# Patient Record
Sex: Male | Born: 1960 | Race: White | Hispanic: No | Marital: Married | Smoking: Never smoker
Health system: Southern US, Community
[De-identification: ages and names within clinical notes are randomized; demographics above are authoritative.]

## PROBLEM LIST (undated history)

## (undated) DIAGNOSIS — N529 Male erectile dysfunction, unspecified: Secondary | ICD-10-CM

## (undated) DIAGNOSIS — I712 Thoracic aortic aneurysm, without rupture, unspecified: Secondary | ICD-10-CM

## (undated) DIAGNOSIS — F419 Anxiety disorder, unspecified: Secondary | ICD-10-CM

## (undated) DIAGNOSIS — N400 Enlarged prostate without lower urinary tract symptoms: Secondary | ICD-10-CM

## (undated) DIAGNOSIS — I10 Essential (primary) hypertension: Secondary | ICD-10-CM

## (undated) HISTORY — PX: TRANSURETHRAL RESECTION OF PROSTATE: SHX73

---

## 2017-10-11 ENCOUNTER — Encounter (HOSPITAL_COMMUNITY): Payer: Self-pay | Admitting: *Deleted

## 2017-10-11 ENCOUNTER — Other Ambulatory Visit: Payer: Self-pay

## 2017-10-11 ENCOUNTER — Observation Stay (HOSPITAL_COMMUNITY)
Admission: EM | Admit: 2017-10-11 | Discharge: 2017-10-11 | Disposition: A | Discharge status: Home / Self Care | Payer: No Typology Code available for payment source | Attending: Family Medicine | Admitting: Family Medicine

## 2017-10-11 ENCOUNTER — Emergency Department (HOSPITAL_COMMUNITY): Payer: No Typology Code available for payment source

## 2017-10-11 DIAGNOSIS — I10 Essential (primary) hypertension: Secondary | ICD-10-CM | POA: Insufficient documentation

## 2017-10-11 DIAGNOSIS — I712 Thoracic aortic aneurysm, without rupture: Secondary | ICD-10-CM | POA: Insufficient documentation

## 2017-10-11 DIAGNOSIS — F419 Anxiety disorder, unspecified: Secondary | ICD-10-CM | POA: Insufficient documentation

## 2017-10-11 DIAGNOSIS — N400 Enlarged prostate without lower urinary tract symptoms: Secondary | ICD-10-CM | POA: Diagnosis not present

## 2017-10-11 DIAGNOSIS — R0789 Other chest pain: Secondary | ICD-10-CM

## 2017-10-11 DIAGNOSIS — R0609 Other forms of dyspnea: Secondary | ICD-10-CM | POA: Diagnosis not present

## 2017-10-11 DIAGNOSIS — R0602 Shortness of breath: Secondary | ICD-10-CM | POA: Diagnosis present

## 2017-10-11 DIAGNOSIS — I7121 Aneurysm of the ascending aorta, without rupture: Secondary | ICD-10-CM | POA: Insufficient documentation

## 2017-10-11 HISTORY — DX: Thoracic aortic aneurysm, without rupture: I71.2

## 2017-10-11 HISTORY — DX: Essential (primary) hypertension: I10

## 2017-10-11 HISTORY — DX: Thoracic aortic aneurysm, without rupture, unspecified: I71.20

## 2017-10-11 HISTORY — DX: Anxiety disorder, unspecified: F41.9

## 2017-10-11 HISTORY — DX: Benign prostatic hyperplasia without lower urinary tract symptoms: N40.0

## 2017-10-11 HISTORY — DX: Male erectile dysfunction, unspecified: N52.9

## 2017-10-11 LAB — I-STAT TROPONIN, ED: Troponin i, poc: 0 ng/mL (ref 0.00–0.08)

## 2017-10-11 LAB — CBC
HEMATOCRIT: 43.5 % (ref 39.0–52.0)
Hemoglobin: 15.4 g/dL (ref 13.0–17.0)
MCH: 30.7 pg (ref 26.0–34.0)
MCHC: 35.4 g/dL (ref 30.0–36.0)
MCV: 86.7 fL (ref 78.0–100.0)
PLATELETS: 171 10*3/uL (ref 150–400)
RBC: 5.02 MIL/uL (ref 4.22–5.81)
RDW: 12.4 % (ref 11.5–15.5)
WBC: 5.3 10*3/uL (ref 4.0–10.5)

## 2017-10-11 LAB — BASIC METABOLIC PANEL
Anion gap: 8 (ref 5–15)
BUN: 11 mg/dL (ref 6–20)
CHLORIDE: 107 mmol/L (ref 101–111)
CO2: 22 mmol/L (ref 22–32)
CREATININE: 0.73 mg/dL (ref 0.61–1.24)
Calcium: 9.2 mg/dL (ref 8.9–10.3)
GFR calc Af Amer: >60 mL/min (ref >60)
GFR calc non Af Amer: >60 mL/min (ref >60)
GLUCOSE: 98 mg/dL (ref 65–99)
POTASSIUM: 3.7 mmol/L (ref 3.5–5.1)
SODIUM: 137 mmol/L (ref 135–145)

## 2017-10-11 LAB — TROPONIN I

## 2017-10-11 MED ORDER — SODIUM CHLORIDE 0.9 % IV SOLN: 250.0000 mL | INTRAVENOUS | Status: DC | PRN

## 2017-10-11 MED ORDER — ALPRAZOLAM 0.5 MG PO TABS: 0.5000 mg | ORAL_TABLET | Freq: Three times a day (TID) | ORAL | 0 refills | Status: AC | PRN

## 2017-10-11 MED ORDER — HYDRALAZINE HCL 20 MG/ML IJ SOLN: 5.0000 mg | INTRAMUSCULAR | Status: DC | PRN

## 2017-10-11 MED ORDER — SODIUM CHLORIDE 0.9% FLUSH: 3.0000 mL | Freq: Two times a day (BID) | INTRAVENOUS | Status: DC

## 2017-10-11 MED ORDER — SODIUM CHLORIDE 0.9% FLUSH: 3.0000 mL | INTRAVENOUS | Status: DC | PRN

## 2017-10-11 MED ORDER — TRAZODONE HCL 50 MG PO TABS: 50.0000 mg | ORAL_TABLET | Freq: Every evening | ORAL | 0 refills | Status: AC | PRN

## 2017-10-11 MED ORDER — TRAZODONE HCL 50 MG PO TABS: 50.0000 mg | ORAL_TABLET | Freq: Every evening | ORAL | 0 refills | Status: DC | PRN

## 2017-10-11 MED ORDER — IOPAMIDOL (ISOVUE-370) INJECTION 76%
INTRAVENOUS | Status: AC
  Administered 2017-10-11: 100 mL
  Filled 2017-10-11: qty 100

## 2017-10-11 MED ORDER — ENOXAPARIN SODIUM 40 MG/0.4ML ~~LOC~~ SOLN
40.0000 mg | SUBCUTANEOUS | Status: DC
  Filled 2017-10-11: qty 0.4

## 2017-10-11 MED ORDER — ALPRAZOLAM 0.5 MG PO TABS: 0.5000 mg | ORAL_TABLET | Freq: Three times a day (TID) | ORAL | Status: DC | PRN

## 2017-10-11 MED ORDER — ASPIRIN 81 MG PO CHEW
324.0000 mg | CHEWABLE_TABLET | Freq: Once | ORAL | Status: AC
  Administered 2017-10-11: 324 mg via ORAL
  Filled 2017-10-11: qty 4

## 2017-10-11 MED ORDER — ALPRAZOLAM 0.5 MG PO TABS: 0.5000 mg | ORAL_TABLET | Freq: Three times a day (TID) | ORAL | 0 refills | Status: DC | PRN

## 2017-10-11 NOTE — H&P (Addendum)
History and Physical    Ronald Eaton DOB: December 02, 1960 DOA: 10/11/2017  PCP: System, Pcp Not In Patient coming from: airport   Chief Complaint: sob  HPI: Ronald Eaton is a 56 y.o. male with medical history significant of BPH s/p TURP, anxiety, HTN, thoracic aortic aneurysm.   Pt in town from [place] visiting for Thanksgiving. Pt set to fly home on day of admission when he developed progressive SOB and DOE. Pt felt like he was unable to breath and could not board his plane. This was associated w/ chest tightness. Denies dizziness, vertigo, lightheadedness, palpitations, radiation of chest tightness to neck or arm,.  Patient has a long-standing history of relative cardiac health given the fact that he is an avid runner. He was readmitted on an ace of 6 m/mi. pace. Up until August of this year he was still running very actively and would typically run for 30 minutes multiple days per week and a 9 minute mile pace. Starting on this patient was initially diagnosed with prostatitis and subsequently with BPH and underwent TURP in early October. During that workup patient was treated with Flomax which made him very lightheaded and had to stop the medication. On 09/06/2017 patient was seen by primary care physician due to concerns for palpitations and hypertension. Since that time patient has had an extensive workup which is included on Holter monitor which showed no significant conduction problems, cardiac echo which showed mild valvular dysfunction and mild diastolic dysfunction, coronary CT which showed patent coronary arteries with no calcifications. Patient was noted to have a mildly elevated blood pressure and intermittent tachycardia so he was started on metoprolol in hopes that this would curb those effects as well as his anxiety. Patient was also started on Klonopin with some improvement in his symptoms. Patient denies any issues with being able to lie flat and sleep at night, lower extremity  swelling, or acute chest pain. Patient describes the shortness of breath as a sensation of being unable to fully catch his breath from time to time. Patient slept very little the night prior to admission. Patient stopped his metoprolol approximately 2-1/2 days prior to admission. He did this due to the fact that he feels that this is the cause of his symptoms.   Of note pt works as a Engineer, technical salesT professionally.     ED Course: Workup as outlined below. Completely unremarkable.  Review of Systems: As per HPI otherwise all other systems reviewed and are negative  Ambulatory Status: no restrictions  Past Medical History:  Diagnosis Date  . Anxiety   . BPH (benign prostatic hyperplasia)   . ED (erectile dysfunction)   . Hypertension   . Thoracic aortic aneurysm [facility](HCC)     Past Surgical History:  Procedure Laterality Date  . TRANSURETHRAL RESECTION OF PROSTATE      Social History   Socioeconomic History  . Marital status: Married    Spouse name: Not on file  . Number of children: Not on file  . Years of education: Not on file  . Highest education level: Not on file  Social Needs  . Financial resource strain: Not on file  . Food insecurity - worry: Not on file  . Food insecurity - inability: Not on file  . Transportation needs - medical: Not on file  . Transportation needs - non-medical: Not on file  Occupational History  . Not on file  Tobacco Use  . Smoking status: Never Smoker  . Smokeless tobacco: Never Used  Substance  and Sexual Activity  . Alcohol use: No    Frequency: Never  . Drug use: No  . Sexual activity: Not on file  Other Topics Concern  . Not on file  Social History Narrative  . Not on file    Allergies  Allergen Reactions  . Metoprolol Shortness Of Breath    Patient began to have shortness of breath after taking this medication for several days, but continued to have shortness of breath after stopping the medication.    Family History  Problem Relation  Age of Onset  . Atrial fibrillation Mother   . Atrial fibrillation Father       Prior to Admission medications   Medication Sig Start Date End Date Taking? Authorizing Provider  acetaminophen (TYLENOL) 500 MG tablet Take 1,000 mg by mouth every 6 (six) hours as needed for mild pain or headache.   Yes [provider]  clonazePAM (KLONOPIN) 0.5 MG tablet Take 0.5 tablets by mouth 2 (two) times daily as needed. 09/24/17  Yes [provider]    Physical Exam: Vitals:   10/11/17 1000 10/11/17 1030 10/11/17 1100 10/11/17 1130  BP:  (!) 116/94 115/89 123/89  Pulse: 73 91 75 65  Resp: 11 12 16 14   Temp:      TempSrc:      SpO2: 99% 98% 98% 98%     General:  Appears calm and comfortable Eyes:  PERRL, EOMI, normal lids, iris ENT:  grossly normal hearing, lips & tongue, mmm Neck:  no LAD, masses or thyromegaly Cardiovascular:  RRR, no m/r/g. No LE edema.  Respiratory:  CTA bilaterally, no w/r/r. Normal respiratory effort. Abdomen:  soft, ntnd, NABS Skin:  no rash or induration seen on limited exam Musculoskeletal:  grossly normal tone BUE/BLE, good ROM, no bony abnormality Psychiatric:  grossly normal mood and affect, speech fluent and appropriate, AOx3 Neurologic:  CN 2-12 grossly intact, moves all extremities in coordinated fashion, sensation intact  Labs on Admission: I have personally reviewed following labs and imaging studies  CBC: Recent Labs  Lab 10/11/17 0608  WBC 5.3  HGB 15.4  HCT 43.5  MCV 86.7  PLT 171   Basic Metabolic Panel: Recent Labs  Lab 10/11/17 0608  NA 137  K 3.7  CL 107  CO2 22  GLUCOSE 98  BUN 11  CREATININE 0.73  CALCIUM 9.2   GFR: CrCl cannot be calculated (Unknown ideal weight.). Liver Function Tests: No results for input(s): AST, ALT, ALKPHOS, BILITOT, PROT, ALBUMIN in the last 168 hours. No results for input(s): LIPASE, AMYLASE in the last 168 hours. No results for input(s): AMMONIA in the last 168 hours. Coagulation  Profile: No results for input(s): INR, PROTIME in the last 168 hours. Cardiac Enzymes: No results for input(s): CKTOTAL, CKMB, CKMBINDEX, TROPONINI in the last 168 hours. BNP (last 3 results) No results for input(s): PROBNP in the last 8760 hours. HbA1C: No results for input(s): HGBA1C in the last 72 hours. CBG: No results for input(s): GLUCAP in the last 168 hours. Lipid Profile: No results for input(s): CHOL, HDL, LDLCALC, TRIG, CHOLHDL, LDLDIRECT in the last 72 hours. Thyroid Function Tests: No results for input(s): TSH, T4TOTAL, FREET4, T3FREE, THYROIDAB in the last 72 hours. Anemia Panel: No results for input(s): VITAMINB12, FOLATE, FERRITIN, TIBC, IRON, RETICCTPCT in the last 72 hours. Urine analysis: No results found for: COLORURINE, APPEARANCEUR, LABSPEC, PHURINE, GLUCOSEU, HGBUR, BILIRUBINUR, KETONESUR, PROTEINUR, UROBILINOGEN, NITRITE, LEUKOCYTESUR  Creatinine Clearance: CrCl cannot be calculated (Unknown ideal weight.).  Sepsis Labs: @LABRCNTIP (procalcitonin:4,lacticidven:4) )No results found for this or any previous visit (from the past 240 hour(s)).   Radiological Exams on Admission: Dg Chest 2 View  Result Date: 10/11/2017 CLINICAL DATA:  Shortness of breath tonight. Elevated blood pressure. EXAM: CHEST  2 VIEW COMPARISON:  None. FINDINGS: Normal heart size and pulmonary vascularity. Emphysematous changes in the lungs. No airspace disease or consolidation. No blunting of costophrenic angles. No pneumothorax. Mediastinal contours appear intact. IMPRESSION: No evidence of active pulmonary disease. Emphysematous changes in the lungs. Electronically Signed   By: Burman Nieves M.D.   On: 10/11/2017 06:36   Ct Angio Chest Pe W And/or Wo Contrast  Result Date: 10/11/2017 CLINICAL DATA:  History mild aortic aneurysm, patient from [place] so imaging there, patient placed on beta blocker one week ago ever since SOB, no chest pain EXAM: CT ANGIOGRAPHY CHEST WITH CONTRAST  TECHNIQUE: Multidetector CT imaging of the chest was performed using the standard protocol during bolus administration of intravenous contrast. Multiplanar CT image reconstructions and MIPs were obtained to evaluate the vascular anatomy. CONTRAST:  ISOVUE-370 IOPAMIDOL (ISOVUE-370) INJECTION 76% COMPARISON:  Chest x-ray 10/11/2017 FINDINGS: Cardiovascular: The pulmonary arteries are well opacified. There is no acute pulmonary embolus. Ascending aorta is 4.1 cm. Descending aorta is normal caliber. Heart size is normal. No pericardial effusion. Mediastinum/Nodes: Esophagus is normal in appearance. No mediastinal, hilar, or axillary adenopathy. The visualized portion of the thyroid gland has a normal appearance. Lungs/Pleura: Lungs are clear. No pleural effusion or pneumothorax. Upper Abdomen: Unremarkable. Musculoskeletal: No chest wall abnormality. No acute or significant osseous findings. Review of the MIP images confirms the above findings. IMPRESSION: 1. Technically adequate exam showing no acute pulmonary embolus. 2. Ascending aortic aneurysm, 4.1 cm. Recommend annual imaging followup by CTA or MRA. This recommendation follows 2010 ACCF/AHA/AATS/ACR/ASA/SCA/SCAI/SIR/STS/SVM Guidelines for the Diagnosis and Management of Patients with Thoracic Aortic Disease. Circulation. 2010; 121: Z610-R604 3. No pulmonary edema or consolidations. Electronically Signed   By: Norva Pavlov M.D.   On: 10/11/2017 08:46    EKG: Independently reviewed. Partial EKG in system. NSR, no ACS. EDP w/ full EKG  Assessment/Plan Active Problems:   SOB (shortness of breath)   Anxiety   Essential hypertension   BPH (benign prostatic hyperplasia)    SOB/chest tightness: suspect anxiety/psychosomatic, and possibly medication induced as symptoms started after initiation of metoprolol.. Extensive cardiac workup prior to evaluation at [facility] as outlined in history of present illness. Currently troponin negative and EKG  without signs of ACS. CT w/o evidence of PE or other acute lung process. Patient states that he feels like he is falling apart from a physical standpoint. These feelings have been present and escalating ever since August due to medical concerns again as outlined in history of present illness. ASA 324 ministered by ED. Fmhx of Afib in both parents. - Treatment of anxiety as below  - cycle trop and tele until DC - Continue holding metoprolol  - Pt wanting to make sure he is safe so will consult cardiology for consultation. Will f/u w/ recommendations for hospital workup vs safe DC.  Anxiety: started klonopin on 10/22. Suspect a lot of this has come on due to pt being very active and healthy overall to dealing with the mental strain that comes w/ becoming ill and requiring surgery (prostatitis and BPH>>>TURP). Klonopin was beneficial w/ no change with treatment w/ metoprolol. Tried prozac for 2-3 days but stopped due to side effects. - Change from klonopin to Xanax -  consider outpt initiation of SNRI  HTN: situational. BP in ED near normal - Hydralazine prn w/ parameters entered  BPH: s/p TRP in august of 2018. No urinary complaints.   Anticipate DC once cleared by cardiology.      DVT prophylaxis: Lovenox  Code Status: full  Family Communication: wife  Disposition Plan: pending eval by cardiology  Consults called: cardiology  Admission status: observation    Ozella Rocksavid J Mariann Palo MD [facility]  If 7PM-7AM, please contact night-coverage [email or web address] Password [email or web address]  10/11/2017, 11:45 AM

## 2017-10-11 NOTE — Consult Note (Signed)
Cardiology Consult    Patient ID: Ronald Eaton MRN: [id number], DOB/AGE: Jun 27, 1961   Admit date: 10/11/2017 Date of Consult: 10/11/2017  Primary Physician: System, Pcp Not In Primary Cardiologist: n/a Requesting Provider: Dr. Konrad DoloresMerrell  Reason for Consult: dyspnea  Patient Profile    Ronald MacadamiaSteven Eddleman is a 56 y.o. male who is being seen today for the evaluation of dyspnea at the request of Dr. Konrad DoloresMerrell. Patient had negative istat and serum troponin and no ischemic EKG changes.  Past Medical History   Past Medical History:  Diagnosis Date  . Anxiety   . BPH (benign prostatic hyperplasia)   . ED (erectile dysfunction)   . Hypertension   . Thoracic aortic aneurysm [facility](HCC)     Past Surgical History:  Procedure Laterality Date  . TRANSURETHRAL RESECTION OF PROSTATE      Allergies  Allergies  Allergen Reactions  . Metoprolol Shortness Of Breath    Patient began to have shortness of breath after taking this medication for several days, but continued to have shortness of breath after stopping the medication.   History of Present Illness    Ronald Eaton is a 56yo male with PMH of HTN, and ascending aortic aneurysm [facility] with complaints of dyspnea. Patient states that for the last few weeks he has been experiencing dyspnea sometimes associated with palpitations. Patient states that he had been in good health until August until he was found to have multiple ticks on his scrotum and also diagnosed with prostatitis which were treated appropriately. He then developed signs and symptoms of BPH and was intolerant of medical therapy so he underwent TURP. Afterwards, patient started having some palpitations and the onset of dyspnea. He has been evaluated by his PCP and cardiologist in [place]. He has had normal Holter monitor readings, echo showed mild 2 valve regurg (from what he could remember), coronary calcium score was 0, and exercise stress test was normal. Patient was placed on  metoprolol for palpitations; he felt his dyspnea was worse with this so he cut down to 12.5mg  BID and eventually stopped 2-3 days ago.  Patient is here visiting for the last week and endorses overall feeling terrible the entire time with the above symptoms; he tried walking and running which seemed to exacerbate his dyspnea. Prior to last two days, he has only had symptoms while awake, but last two nights he has woken up gasping and clearing his throat. He endorses some post-nasal drip. He denies chest pain; endorses occasional dizziness on standing when he misses a meal. He had two episodes of syncope during micturition prior to his TURP. He feels that his throat is sometimes tight as well. He denies fevers, chills, productive cough.   Inpatient Medications    . enoxaparin (LOVENOX) injection  40 mg Subcutaneous Q24H  . sodium chloride flush  3 mL Intravenous Q12H   Outpatient Medications    Prior to Admission medications   Medication Sig Start Date End Date Taking? Authorizing Provider  acetaminophen (TYLENOL) 500 MG tablet Take 1,000 mg by mouth every 6 (six) hours as needed for mild pain or headache.   Yes [provider]  clonazePAM (KLONOPIN) 0.5 MG tablet Take 0.5 tablets by mouth 2 (two) times daily as needed. 09/24/17  Yes [provider]   Family History    Family History  Problem Relation Age of Onset  . Atrial fibrillation Mother   . Atrial fibrillation Father    Social History    Social History  Socioeconomic History  . Marital status: Married    Spouse name: Not on file  . Number of children: Not on file  . Years of education: Not on file  . Highest education level: Not on file  Social Needs  . Financial resource strain: Not on file  . Food insecurity - worry: Not on file  . Food insecurity - inability: Not on file  . Transportation needs - medical: Not on file  . Transportation needs - non-medical: Not on file  Occupational History  . Not on  file  Tobacco Use  . Smoking status: Never Smoker  . Smokeless tobacco: Never Used  Substance and Sexual Activity  . Alcohol use: No    Frequency: Never  . Drug use: No  . Sexual activity: Not on file  Other Topics Concern  . Not on file  Social History Narrative  . Not on file     Review of Systems    All other systems reviewed and are otherwise negative except as noted above.  Physical Exam    Blood pressure 117/88, pulse 70, temperature (!) 97.5 F (36.4 C), temperature source Oral, resp. rate 10, SpO2 99 %.  General: NAD Psych: Normal affect. Neuro: Alert and oriented X 3. Moves all extremities spontaneously. HEENT: Normal  Neck: Supple without bruits or JVD. Lungs:  Resp regular and unlabored, CTA. Heart: RRR no s3, s4, or murmurs. Abdomen: Soft, non-tender, non-distended, +BS.  Extremities: No clubbing, cyanosis or edema. DP/PT/Radials 2+ and equal bilaterally.  Labs    Troponin [facility](Point of Care Test) Recent Labs    10/11/17 0617  TROPIPOC 0.00   Recent Labs    10/11/17 1213  TROPONINI <0.03   Lab Results  Component Value Date   WBC 5.3 10/11/2017   HGB 15.4 10/11/2017   HCT 43.5 10/11/2017   MCV 86.7 10/11/2017   PLT 171 10/11/2017    Recent Labs  Lab 10/11/17 0608  NA 137  K 3.7  CL 107  CO2 22  BUN 11  CREATININE 0.73  CALCIUM 9.2  GLUCOSE 98   No results found for: CHOL, HDL, LDLCALC, TRIG No results found for: [facility]   Radiology Studies    Dg Chest 2 View  Result Date: 10/11/2017 CLINICAL DATA:  Shortness of breath tonight. Elevated blood pressure. EXAM: CHEST  2 VIEW COMPARISON:  None. FINDINGS: Normal heart size and pulmonary vascularity. Emphysematous changes in the lungs. No airspace disease or consolidation. No blunting of costophrenic angles. No pneumothorax. Mediastinal contours appear intact. IMPRESSION: No evidence of active pulmonary disease. Emphysematous changes in the lungs. Electronically Signed   By: Burman NievesWilliam  Stevens  M.D.   On: 10/11/2017 06:36   Ct Angio Chest Pe W And/or Wo Contrast  Result Date: 10/11/2017 CLINICAL DATA:  History mild aortic aneurysm, patient from [place] so imaging there, patient placed on beta blocker one week ago ever since SOB, no chest pain EXAM: CT ANGIOGRAPHY CHEST WITH CONTRAST TECHNIQUE: Multidetector CT imaging of the chest was performed using the standard protocol during bolus administration of intravenous contrast. Multiplanar CT image reconstructions and MIPs were obtained to evaluate the vascular anatomy. CONTRAST:  100mL ISOVUE-370 IOPAMIDOL (ISOVUE-370) INJECTION 76% COMPARISON:  Chest x-ray 10/11/2017 FINDINGS: Cardiovascular: The pulmonary arteries are well opacified. There is no acute pulmonary embolus. Ascending aorta is 4.1 cm. Descending aorta is normal caliber. Heart size is normal. No pericardial effusion. Mediastinum/Nodes: Esophagus is normal in appearance. No mediastinal, hilar, or axillary adenopathy. The visualized portion of the  thyroid gland has a normal appearance. Lungs/Pleura: Lungs are clear. No pleural effusion or pneumothorax. Upper Abdomen: Unremarkable. Musculoskeletal: No chest wall abnormality. No acute or significant osseous findings. Review of the MIP images confirms the above findings. IMPRESSION: 1. Technically adequate exam showing no acute pulmonary embolus. 2. Ascending aortic aneurysm, 4.1 cm. Recommend annual imaging followup by CTA or MRA. This recommendation follows 2010 ACCF/AHA/AATS/ACR/ASA/SCA/SCAI/SIR/STS/SVM Guidelines for the Diagnosis and Management of Patients with Thoracic Aortic Disease. Circulation. 2010; 121: U981-X914 3. No pulmonary edema or consolidations. Electronically Signed   By: Norva Pavlov M.D.   On: 10/11/2017 08:46   ECG & Cardiac Imaging    EKG, personally reviewed - SR  Patient - echo with mild valvular dysfunction and mild diastolic dysfunction; coronary CT with patent coronary arteries with no  calcifications  Assessment & Plan    Dyspnea: Some concerning symptoms with dizziness on standing and worsening with exertion, however in setting of overall normal workup per patient, would not consider further cardiac workup at this time. CTA was unremarkable from pulmonary standpoint as well.   Ascending aortic aneurysm: 4.1cm in diameter - per patient stable size from most recent imaging prior to today.  --annual f/u with CTA or MRA  HTN: Metoprolol held. Patient hesitant to start any BP meds; on review he has had intermittent elevated diastolic BP. Could consider outpatient monitoring and further discussion with his PCP on starting new meds if consistently elevated.   Signed, Nyra Market, MD 10/11/2017, 1:40 PM   I have examined the patient and reviewed assessment and plan and discussed with patient.  Agree with above as stated.  He has had a negative workup.  Negative troponins, ECG without abnormality.  He had a coronary CT scan in [place] with a calcium score of 0.  Negative PE workup.  No clear cardiac cause for his shortness of breath.  He should follow-up with his primary care doctor regarding his blood pressure.  I recommended a low-salt diet along with regular exercise to help control his blood pressure.  He has a small aortic aneurysm as well.  No abnormalities on exam consistent with significant valvular disease.  He had been on metoprolol.  He felt worse on the metoprolol compared to off the metoprolol.  I recommended that he stop this medicine.  Ronald Eaton

## 2017-10-11 NOTE — ED Notes (Signed)
This RN spoke with Dr Konrad DoloresMerrell, pt is waiting on cardiology to come see him and if cardiology gives the the okay, then he is being discharged home. Wife provided with a noted stating that the pt is in the hospital to give to the airport. Pt sitting up in a chair at the bedside, VSS, NAD.

## 2017-10-11 NOTE — Discharge Instructions (Signed)
It is unlikely that your symptoms of chest tightness and shortness of breath are due to your heart or lungs. They are likely related to a combination of the metoprolol and anxiety and stress. Please follow up with your primary care physician or cardiologist in 1-2weeks. Please use the Xanax for anxiety and symptoms of shortness of breath. Please do not use the metoprolol anymore.

## 2017-10-11 NOTE — ED Provider Notes (Signed)
[facility] EMERGENCY DEPARTMENT Provider Note   CSN: [id number] Arrival date & time: 10/11/17  [id number]     History   Chief Complaint Chief Complaint  Patient presents with  . Shortness of Breath    HPI Ronald Eaton is a 56 y.o. male.  HPI   Ronald Eaton is a 56 y.o. male, with a history of HTN, presenting to the ED with chest tightness and exertional shortness of breath worsening over the last week.  Sensation is constant, worse with sitting upright and ambulation.  States the tightness is across his chest, worse on the left, 5/10. States, "It just feels like I can't get a deep breath."  Patient is concerned his symptoms are connected with his recent placement on metoprolol.  States his PCP placed him on the metoprolol due to concern for HTN as well as anxiety beginning Nov 14. He was on 25 mg twice a day, decreased this to 12.5 mg twice a day, and then 2 days ago stopped taking this medication completely. Patient took a 3-hour plane ride on November 20 from his home in [place] to visit family here in [place].  He states he was having some of his symptoms a couple days prior to this after a 5-hour car ride and then an overnight stay in a hotel.  Patient was supposed to fly home today.  He was short of breath walking to the gate.  Continued to be short of breath at the gate and decided to come to the ED.  Denies fever/chills, N/V/D, diaphoresis, pallor, cough, abdominal pain, or any other complaints.    Past Medical History:  Diagnosis Date  . Anxiety   . ED (erectile dysfunction)   . Hypertension   . Thoracic aortic aneurysm (HCC)     There are no active problems to display for this patient.   Past Surgical History:  Procedure Laterality Date  . TRANSURETHRAL RESECTION OF PROSTATE         Home Medications    Prior to Admission medications   Medication Sig Start Date End Date Taking? Authorizing Provider  acetaminophen (TYLENOL) 500 MG tablet  Take 1,000 mg by mouth every 6 (six) hours as needed for mild pain or headache.   Yes [provider]  clonazePAM (KLONOPIN) 0.5 MG tablet Take 0.5 tablets by mouth 2 (two) times daily as needed. 09/24/17  Yes [provider]    Family History History reviewed. No pertinent family history.  Social History Social History   Tobacco Use  . Smoking status: Never Smoker  . Smokeless tobacco: Never Used  Substance Use Topics  . Alcohol use: No    Frequency: Never  . Drug use: No     Allergies   Metoprolol   Review of Systems Review of Systems  Constitutional: Negative for chills, diaphoresis and fever.  Respiratory: Positive for shortness of breath. Negative for cough.   Gastrointestinal: Negative for abdominal pain, constipation, diarrhea, nausea and vomiting.  Musculoskeletal: Negative for back pain.  Neurological: Negative for dizziness and light-headedness.  All other systems reviewed and are negative.    Physical Exam Updated Vital Signs BP (!) 122/91 (BP Location: Left Arm)   Pulse 79   Temp (!) 97.5 F (36.4 C) (Oral)   Resp 18   SpO2 99%   Physical Exam  Constitutional: He appears well-developed and well-nourished. No distress.  HENT:  Head: Normocephalic and atraumatic.  Eyes: Conjunctivae are normal.  Neck: Neck supple.  Cardiovascular: Normal  rate, regular rhythm, normal heart sounds and intact distal pulses.  Pulmonary/Chest: Breath sounds normal.  Patient appears somewhat uncomfortable at rest.  Increased work of breathing with even minor exertion.  Abdominal: Soft. There is no tenderness. There is no guarding.  Musculoskeletal: He exhibits no edema.  Lymphadenopathy:    He has no cervical adenopathy.  Neurological: He is alert.  Skin: Skin is warm and dry. Capillary refill takes less than 2 seconds. He is not diaphoretic.  Psychiatric: He has a normal mood and affect. His behavior is normal.  Nursing note and vitals  reviewed.    ED Treatments / Results  Labs (all labs ordered are listed, but only abnormal results are displayed) Labs Reviewed  BASIC METABOLIC PANEL  CBC  I-STAT TROPONIN, ED    EKG  EKG Interpretation  Date/Time:  Monday October 11 2017 05:58:51 EST Ventricular Rate:  91 PR Interval:    QRS Duration: 93 QT Interval:  359 QTC Calculation: 442 R Axis:   84 Text Interpretation:  Sinus rhythm minimal ST elevation in V3 Confirmed by Ashland, Courteney ([id number]) on 10/11/2017 9:11:05 AM       Radiology Dg Chest 2 View  Result Date: 10/11/2017 CLINICAL DATA:  Shortness of breath tonight. Elevated blood pressure. EXAM: CHEST  2 VIEW COMPARISON:  None. FINDINGS: Normal heart size and pulmonary vascularity. Emphysematous changes in the lungs. No airspace disease or consolidation. No blunting of costophrenic angles. No pneumothorax. Mediastinal contours appear intact. IMPRESSION: No evidence of active pulmonary disease. Emphysematous changes in the lungs. Electronically Signed   By: Burman Nieves M.D.   On: 10/11/2017 06:36   Ct Angio Chest Pe W And/or Wo Contrast  Result Date: 10/11/2017 CLINICAL DATA:  History mild aortic aneurysm, patient from [place] so imaging there, patient placed on beta blocker one week ago ever since SOB, no chest pain EXAM: CT ANGIOGRAPHY CHEST WITH CONTRAST TECHNIQUE: Multidetector CT imaging of the chest was performed using the standard protocol during bolus administration of intravenous contrast. Multiplanar CT image reconstructions and MIPs were obtained to evaluate the vascular anatomy. CONTRAST:  ISOVUE-370 IOPAMIDOL (ISOVUE-370) INJECTION 76% COMPARISON:  Chest x-ray 10/11/2017 FINDINGS: Cardiovascular: The pulmonary arteries are well opacified. There is no acute pulmonary embolus. Ascending aorta is 4.1 cm. Descending aorta is normal caliber. Heart size is normal. No pericardial effusion. Mediastinum/Nodes: Esophagus is normal in appearance. No  mediastinal, hilar, or axillary adenopathy. The visualized portion of the thyroid gland has a normal appearance. Lungs/Pleura: Lungs are clear. No pleural effusion or pneumothorax. Upper Abdomen: Unremarkable. Musculoskeletal: No chest wall abnormality. No acute or significant osseous findings. Review of the MIP images confirms the above findings. IMPRESSION: 1. Technically adequate exam showing no acute pulmonary embolus. 2. Ascending aortic aneurysm, 4.1 cm. Recommend annual imaging followup by CTA or MRA. This recommendation follows 2010 ACCF/AHA/AATS/ACR/ASA/SCA/SCAI/SIR/STS/SVM Guidelines for the Diagnosis and Management of Patients with Thoracic Aortic Disease. Circulation. 2010; 121: W102-V253 3. No pulmonary edema or consolidations. Electronically Signed   By: Norva Pavlov M.D.   On: 10/11/2017 08:46    Procedures Procedures (including critical care time)  Medications Ordered in ED Medications  aspirin chewable tablet 324 mg (not administered)  iopamidol (ISOVUE-370) 76 % injection (100 mLs  Contrast Given 10/11/17 0823)     Initial Impression / Assessment and Plan / ED Course  I have reviewed the triage vital signs and the nursing notes.  Pertinent labs & imaging results that were available during my care of the patient were  reviewed by me and considered in my medical decision making (see chart for details).  Clinical Course as of Oct 12 951  [facility]  [facility]30 Discussed imaging results with the patient.  Also discussed admission for observation.  Patient voices understanding of this information and is comfortable with the plan.  [SJ]  0948 No evidence of PE.  Patient states he is aware of the ascending thoracic aortic aneurysm.  States he thinks the current measurement of 4.1 cm is consistent with the previous measurement. CT Angio Chest PE W and/or Wo Contrast [SJ]  [id number] Spoke with Shelly Flattenavid Merrell, hospitalist. Agrees to admit the patient.  [SJ]    Clinical Course User  Index [SJ] Joy, Shawn C, PA-C     Patient presents with chest discomfort and shortness of breath. HEART score of 3 and Wells score of 3.  Initial troponin negative. No noted PE on CT. Chest tightness and shortness of breath with exertion. Admission for chest pain rule out and observation.  Findings and plan of care discussed with Bary Castillaourteney Mackuen, MD.   Vitals:   10/11/17 0715 10/11/17 0745 10/11/17 0800 10/11/17 0900  BP: (!) 115/92 (!) 131/96 (!) 123/91   Pulse: 69 79 78 97  Resp: 14 10 17 14   Temp:      TempSrc:      SpO2: 99% 100% 98% 100%    Final Clinical Impressions(s) / ED Diagnoses   Final diagnoses:  SOB (shortness of breath)    ED Discharge Orders    None       Concepcion LivingJoy, Shawn C, PA-C 10/11/17 [id number]    Abelino DerrickMackuen, Courteney Lyn, MD 10/12/17 [phone]

## 2017-10-11 NOTE — ED Triage Notes (Addendum)
Pt is from [place] and has been visiting here for the past week. Pt reports ongoing sob for the past week with difficulty taking a deep breath. Feels that Left side of chest is more tight during inspiration. Pt recently had a full cardiac workup with normal results. Also reports having a scheduled CT of chest today in [place] but has had flight delays to get back home. Has been started on Metoprolol for hypertension and started taking klonopin for anxiety with no improvement of symptoms. Last took metoprolol two days ago.  Lung sounds clear on assessment

## 2017-10-11 NOTE — Discharge Summary (Signed)
Physician Discharge Summary  Ronald Eaton ZOX:[id number] DOB: May 25, 1961 DOA: 10/11/2017  PCP: System, Pcp Not In  Admit date: 10/11/2017 Discharge date: 10/11/2017  Admitted From: airport Disposition:  Home  Recommendations for Outpatient Follow-up:  Follow up with PCP in 1-2 weeks  Home Health:No Equipment/Devices:none  Discharge Condition:stable CODE STATUS:full Diet recommendation: Regular  Brief/Interim Summary: Ronald Eaton is a 56 y.o. male with medical history significant of BPH s/p TURP, anxiety, HTN, thoracic aortic aneurysm.   Pt in town from [place] visiting for Thanksgiving. Pt set to fly home on day of admission when he developed progressive SOB and DOE. Pt felt like he was unable to breath and could not board his plane. This was associated w/ chest tightness. Denies dizziness, vertigo, lightheadedness, palpitations, radiation of chest tightness to neck or arm,.  Patient has a long-standing history of relative cardiac health given the fact that he is an avid runner. He was readmitted on an ace of 6 m/mi. pace. Up until August of this year he was still running very actively and would typically run for 30 minutes multiple days per week and a 9 minute mile pace. Starting on this patient was initially diagnosed with prostatitis and subsequently with BPH and underwent TURP in early October. During that workup patient was treated with Flomax which made him very lightheaded and had to stop the medication. On 09/06/2017 patient was seen by primary care physician due to concerns for palpitations and hypertension. Since that time patient has had an extensive workup which is included on Holter monitor which showed no significant conduction problems, cardiac echo which showed mild valvular dysfunction and mild diastolic dysfunction, coronary CT which showed patent coronary arteries with no calcifications. Patient was noted to have a mildly elevated blood pressure and intermittent tachycardia so  he was started on metoprolol in hopes that this would curb those effects as well as his anxiety. Patient was also started on Klonopin with some improvement in his symptoms. Patient denies any issues with being able to lie flat and sleep at night, lower extremity swelling, or acute chest pain. Patient describes the shortness of breath as a sensation of being unable to fully catch his breath from time to time. Patient slept very little the night prior to admission. Patient stopped his metoprolol approximately 2-1/2 days prior to admission. He did this due to the fact that he feels that this is the cause of his symptoms.    Discharge Diagnoses:  Active Problems:   SOB (shortness of breath)   Anxiety   Essential hypertension   BPH (benign prostatic hyperplasia)  SOB/chest tightness: suspect anxiety/psychosomatic, and possibly medication induced as symptoms started after initiation of metoprolol.. Extensive cardiac workup prior to evaluation at [facility] as outlined in history of present illness. Currently troponin negative and EKG without signs of ACS. CT w/o evidence of PE or other acute lung process. Patient states that he feels like he is falling apart from a physical standpoint. These feelings have been present and escalating ever since August due to medical concerns again as outlined in history of present illness. ASA 324 ministered by ED. Fmhx of Afib in both parents. Cardiology consulted. After their evaluation they also determined that patient's symptoms are not due to cardiac etiology. Patient follow up with home cardiologist after arrival in [place]. Troponins negative 2  Anxiety: started klonopin on 10/22. Suspect a lot of this has come on due to pt being very active and healthy overall to dealing with the mental strain  that comes w/ becoming ill and requiring surgery (prostatitis and BPH>>>TURP). Klonopin was beneficial w/ no change with treatment w/ metoprolol. Tried prozac for 2-3 days but  stopped due to side effects. - Change from klonopin to Xanax - consider outpt initiation of SNRI  Ascending aortic aneurysm: 4.1 on scan. Stable. Patient to follow-up in the outpatient setting.  HTN: situational. BP in ED near normal - Follow-up with primary care physician to see if antihypertensives are still needed. At time of hospitalization patient's blood pressure was well-controlled.  BPH: s/p TRP in august of 2018. No urinary complaints.     Discharge Instructions Follow up with PCP in 1 -2 weeks upon regutrn   Allergies as of 10/11/2017      Reactions   Metoprolol Shortness Of Breath   Patient began to have shortness of breath after taking this medication for several days, but continued to have shortness of breath after stopping the medication.      Medication List    STOP taking these medications   clonazePAM 0.5 MG tablet Commonly known as:  KLONOPIN     TAKE these medications   acetaminophen 500 MG tablet Commonly known as:  TYLENOL Take 1,000 mg by mouth every 6 (six) hours as needed for mild pain or headache.   ALPRAZolam 0.5 MG tablet Commonly known as:  XANAX Take 1 tablet (0.5 mg total) by mouth 3 (three) times daily as needed for anxiety.   traZODone 50 MG tablet Commonly known as:  DESYREL Take 1 tablet (50 mg total) by mouth at bedtime as needed for sleep.       Allergies  Allergen Reactions  . Metoprolol Shortness Of Breath    Patient began to have shortness of breath after taking this medication for several days, but continued to have shortness of breath after stopping the medication.    Consultations: Cardiology  Procedures/Studies: Dg Chest 2 View  Result Date: 10/11/2017 CLINICAL DATA:  Shortness of breath tonight. Elevated blood pressure. EXAM: CHEST  2 VIEW COMPARISON:  None. FINDINGS: Normal heart size and pulmonary vascularity. Emphysematous changes in the lungs. No airspace disease or consolidation. No blunting of costophrenic  angles. No pneumothorax. Mediastinal contours appear intact. IMPRESSION: No evidence of active pulmonary disease. Emphysematous changes in the lungs. Electronically Signed   By: Burman NievesWilliam  Stevens M.D.   On: 10/11/2017 06:36   Ct Angio Chest Pe W And/or Wo Contrast  Result Date: 10/11/2017 CLINICAL DATA:  History mild aortic aneurysm, patient from [place] so imaging there, patient placed on beta blocker one week ago ever since SOB, no chest pain EXAM: CT ANGIOGRAPHY CHEST WITH CONTRAST TECHNIQUE: Multidetector CT imaging of the chest was performed using the standard protocol during bolus administration of intravenous contrast. Multiplanar CT image reconstructions and MIPs were obtained to evaluate the vascular anatomy. CONTRAST:  100mL ISOVUE-370 IOPAMIDOL (ISOVUE-370) INJECTION 76% COMPARISON:  Chest x-ray 10/11/2017 FINDINGS: Cardiovascular: The pulmonary arteries are well opacified. There is no acute pulmonary embolus. Ascending aorta is 4.1 cm. Descending aorta is normal caliber. Heart size is normal. No pericardial effusion. Mediastinum/Nodes: Esophagus is normal in appearance. No mediastinal, hilar, or axillary adenopathy. The visualized portion of the thyroid gland has a normal appearance. Lungs/Pleura: Lungs are clear. No pleural effusion or pneumothorax. Upper Abdomen: Unremarkable. Musculoskeletal: No chest wall abnormality. No acute or significant osseous findings. Review of the MIP images confirms the above findings. IMPRESSION: 1. Technically adequate exam showing no acute pulmonary embolus. 2. Ascending aortic aneurysm, 4.1 cm. Recommend  annual imaging followup by CTA or MRA. This recommendation follows 2010 ACCF/AHA/AATS/ACR/ASA/SCA/SCAI/SIR/STS/SVM Guidelines for the Diagnosis and Management of Patients with Thoracic Aortic Disease. Circulation. 2010; 121: [id number] 3. No pulmonary edema or consolidations. Electronically Signed   By: Norva PavlovElizabeth  Brown M.D.   On: 10/11/2017 08:46   (Echo, Carotid,  EGD, Colonoscopy, ERCP)    Subjective:   Discharge Exam: Vitals:   10/11/17 1545 10/11/17 1600  BP: 127/83 107/87  Pulse: 70   Resp: 13 (!) 21  Temp:    SpO2: 100%    Vitals:   10/11/17 1515 10/11/17 1530 10/11/17 1545 10/11/17 1600  BP: 115/85 115/79 127/83 107/87  Pulse: 67 72 70   Resp: 15 11 13  (!) 21  Temp:      TempSrc:      SpO2: 97% 99% 100%     General: Pt is alert, awake, not in acute distress Cardiovascular: RRR, S1/S2 +, no rubs, no gallops Respiratory: CTA bilaterally, no wheezing, no rhonchi Abdominal: Soft, NT, ND, bowel sounds + Extremities: no edema, no cyanosis    The results of significant diagnostics from this hospitalization (including imaging, microbiology, ancillary and laboratory) are listed below for reference.     Microbiology: No results found for this or any previous visit (from the past 240 hour(s)).   Labs: BNP (last 3 results) No results for input(s): BNP in the last 8760 hours. Basic Metabolic Panel: Recent Labs  Lab 10/11/17 0608  NA 137  K 3.7  CL 107  CO2 22  GLUCOSE 98  BUN 11  CREATININE 0.73  CALCIUM 9.2   Liver Function Tests: No results for input(s): AST, ALT, ALKPHOS, BILITOT, PROT, ALBUMIN in the last 168 hours. No results for input(s): LIPASE, AMYLASE in the last 168 hours. No results for input(s): AMMONIA in the last 168 hours. CBC: Recent Labs  Lab 10/11/17 0608  WBC 5.3  HGB 15.4  HCT 43.5  MCV 86.7  PLT 171   Cardiac Enzymes: Recent Labs  Lab 10/11/17 1213  TROPONINI <0.03   BNP: Invalid input(s): POCBNP CBG: No results for input(s): GLUCAP in the last 168 hours. D-Dimer No results for input(s): DDIMER in the last 72 hours. Hgb A1c No results for input(s): HGBA1C in the last 72 hours. Lipid Profile No results for input(s): CHOL, HDL, LDLCALC, TRIG, CHOLHDL, LDLDIRECT in the last 72 hours. Thyroid function studies No results for input(s): TSH, T4TOTAL, T3FREE, THYROIDAB in the last 72  hours.  Invalid input(s): FREET3 Anemia work up No results for input(s): VITAMINB12, FOLATE, FERRITIN, TIBC, IRON, RETICCTPCT in the last 72 hours. Urinalysis No results found for: COLORURINE, APPEARANCEUR, LABSPEC, PHURINE, GLUCOSEU, HGBUR, BILIRUBINUR, KETONESUR, PROTEINUR, UROBILINOGEN, NITRITE, LEUKOCYTESUR Sepsis Labs Invalid input(s): PROCALCITONIN,  WBC,  LACTICIDVEN Microbiology No results found for this or any previous visit (from the past 240 hour(s)).   Time coordinating discharge: Over 30 minutes  SIGNED:   Ozella Rocksavid J Edie Darley, MD  [facility] 10/11/2017, 4:42 PM Pager   If 7PM-7AM, please contact night-coverage [email or web address] Password [email or web address]

## 2017-10-11 NOTE — ED Notes (Signed)
Dr. Merrell at bedside. 

## 2018-05-29 IMAGING — CT CT ANGIO CHEST
2 of 6 series · 18 of 36 positions shown · IV contrast (Omni 300)
Comparison: Chest x-ray 10/11/2017

CLINICAL DATA: History mild aortic aneurysm, patient from [place] so
imaging there, patient placed on beta blocker one week ago ever
since SOB, no chest pain

EXAM:
CT ANGIOGRAPHY CHEST WITH CONTRAST
TECHNIQUE: Multidetector CT imaging of the chest was performed using the
standard protocol during bolus administration of intravenous
contrast. Multiplanar CT image reconstructions and MIPs were
obtained to evaluate the vascular anatomy.
CONTRAST:  100mL [id number] IOPAMIDOL ([id number]) INJECTION 76%

[Series 8: pe thins · axial · 0.76mm/px · z∈[-144,+104]mm · 17 of 279 slices shown]
[im 16/279  lung]
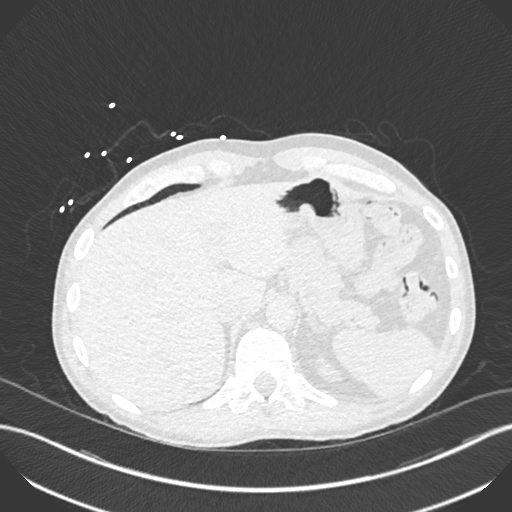
[im 31/279  mediastinal]
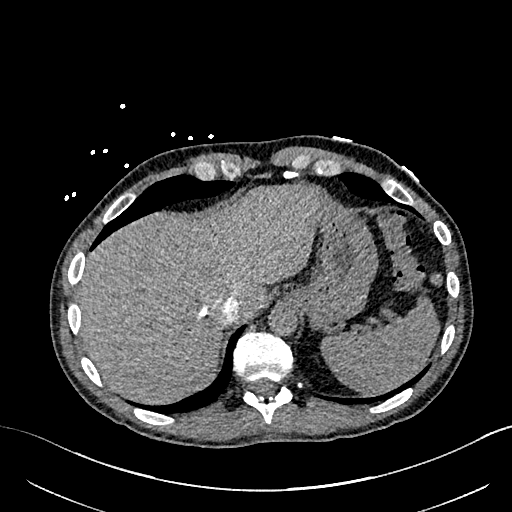
[im 47/279  lung]
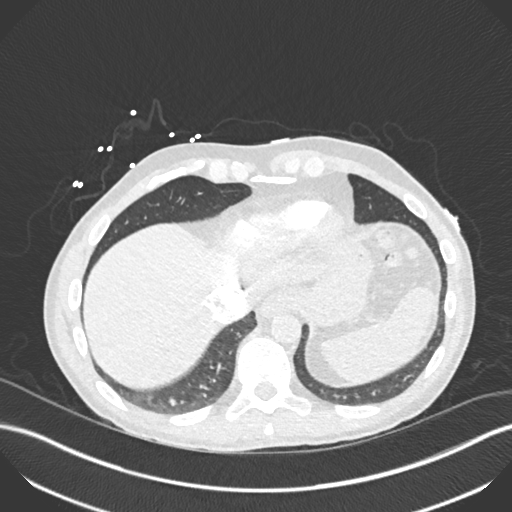
[im 62/279  mediastinal]
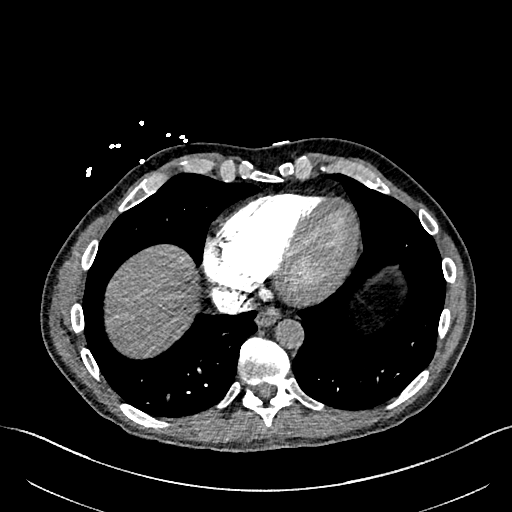
[im 78/279  lung]
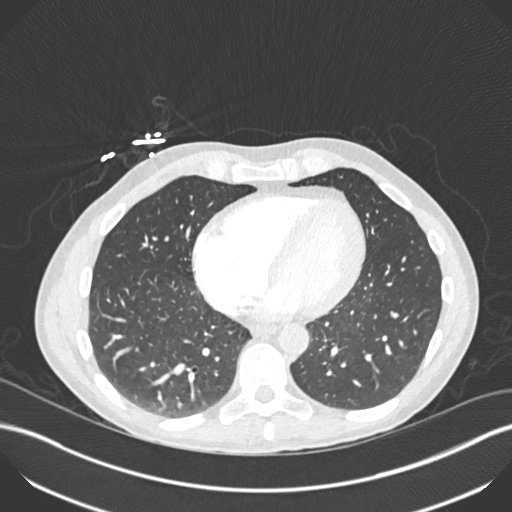
[im 93/279  mediastinal]
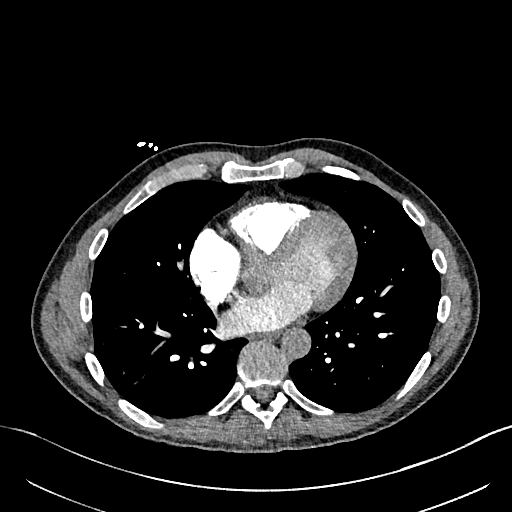
[im 109/279  lung]
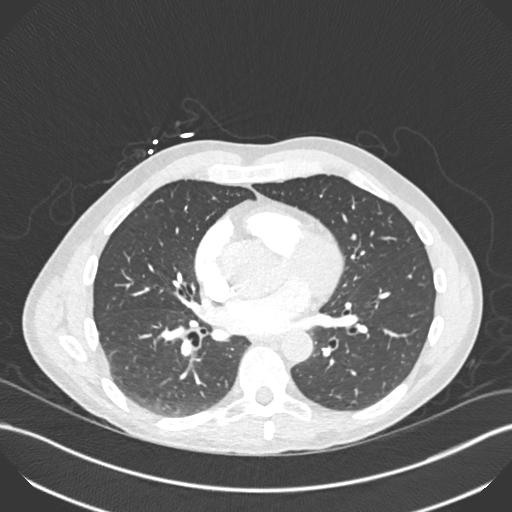
[im 124/279  mediastinal]
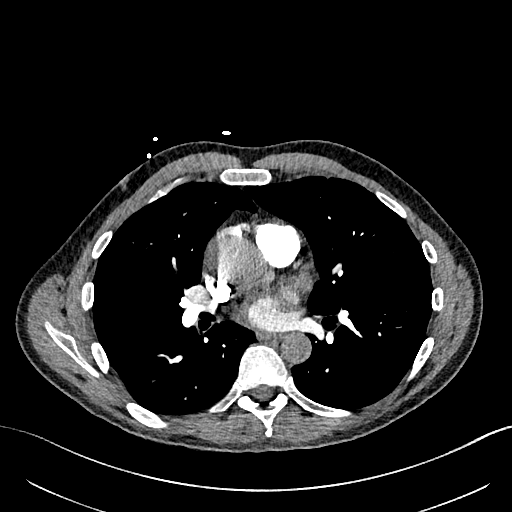
[im 140/279  lung]
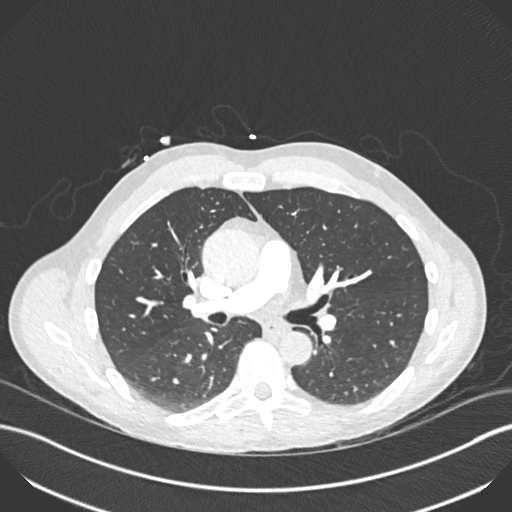
[im 155/279  mediastinal]
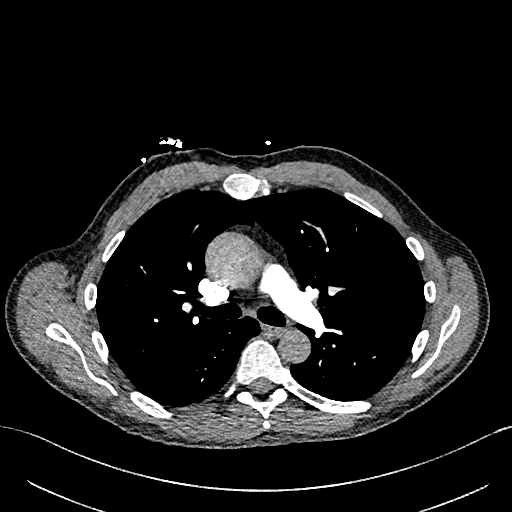
[im 170/279  lung]
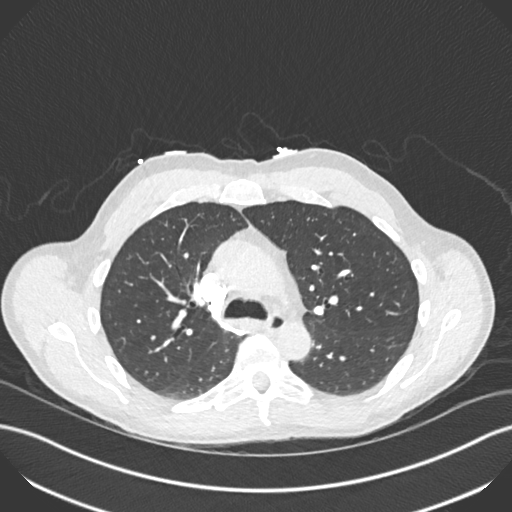
[im 186/279  mediastinal]
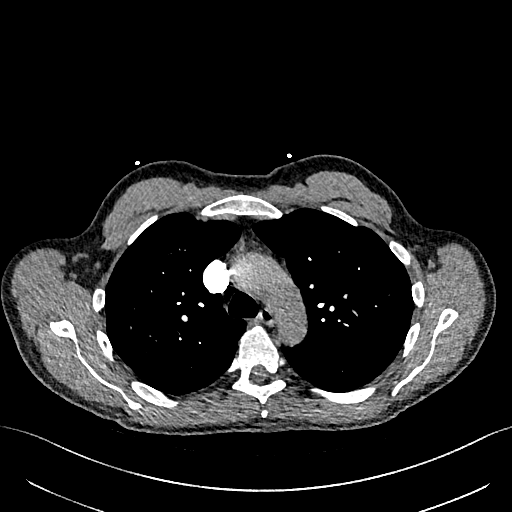
[im 201/279  lung]
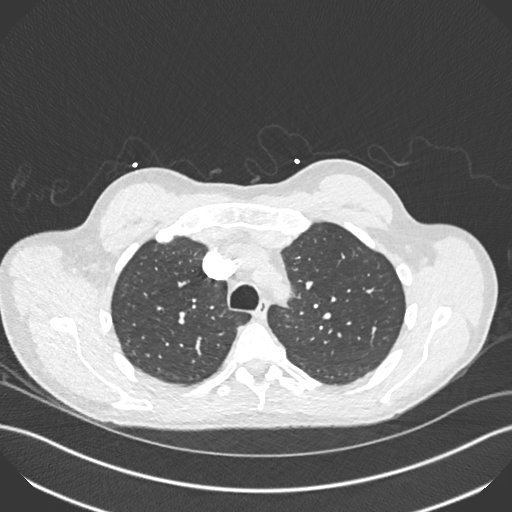
[im 217/279  mediastinal]
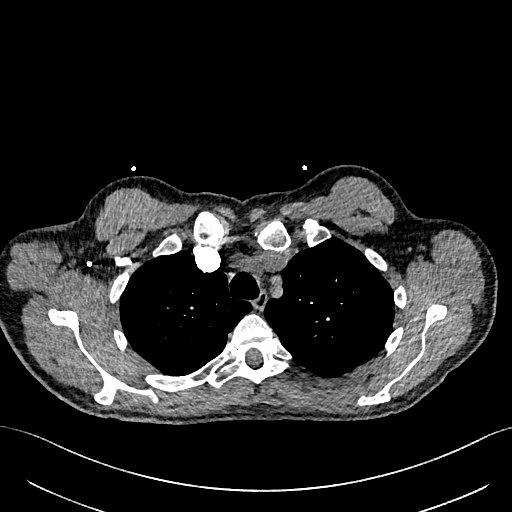
[im 232/279  lung]
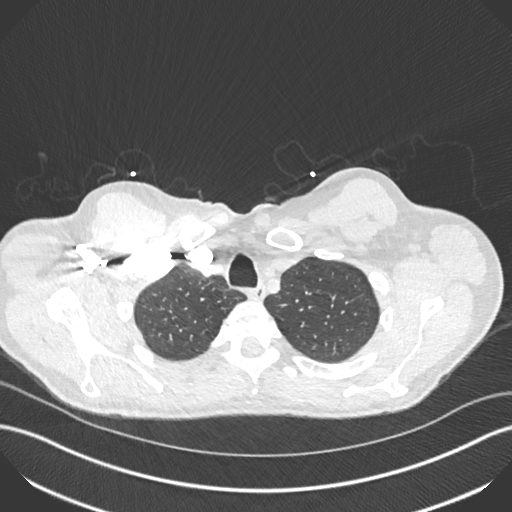
[im 248/279  mediastinal]
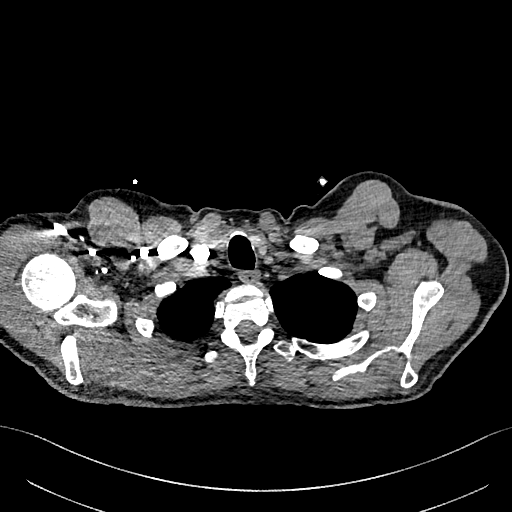
[im 263/279  lung]
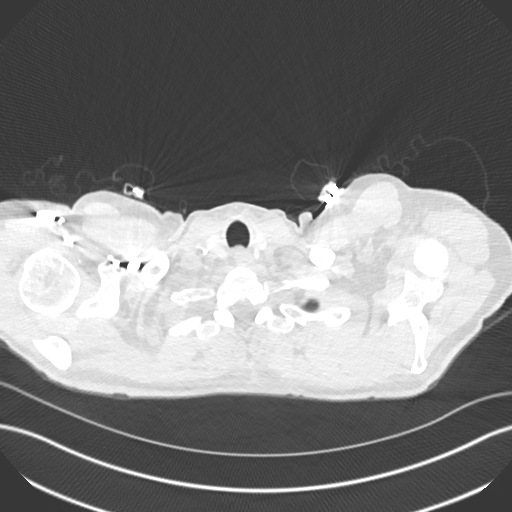

[Series 9: pe 2mm cor · coronal · 0.59mm/px · 1 of 120 slices shown]
[im 60/120  mediastinal]
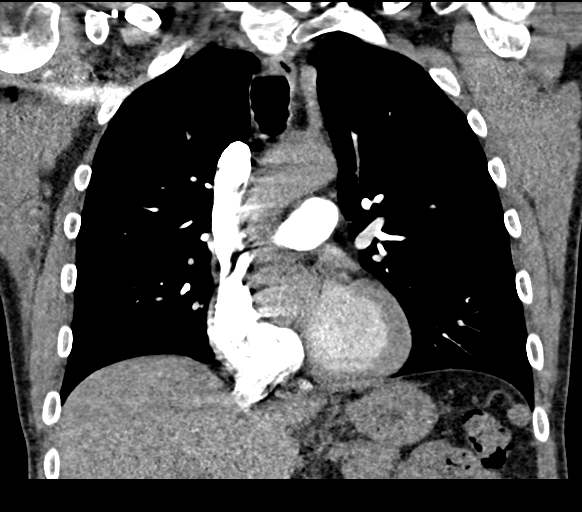

[18 of 36 positions shown; findings below may reference images not displayed]

FINDINGS: Cardiovascular: The pulmonary arteries are well opacified. There is
no acute pulmonary embolus. Ascending aorta is 4.1 cm. Descending
aorta is normal caliber. Heart size is normal. No pericardial
effusion.

Mediastinum/Nodes: Esophagus is normal in appearance. No
mediastinal, hilar, or axillary adenopathy. The visualized portion
of the thyroid gland has a normal appearance.

Lungs/Pleura: Lungs are clear. No pleural effusion or pneumothorax.

Upper Abdomen: Unremarkable.

Musculoskeletal: No chest wall abnormality. No acute or significant
osseous findings.

Review of the MIP images confirms the above findings.
IMPRESSION: 1. Technically adequate exam showing no acute pulmonary embolus.
2. Ascending aortic aneurysm, 4.1 cm. Recommend annual imaging
followup by CTA or MRA. This recommendation follows 5808
ACCF/AHA/AATS/ACR/ASA/SCA/SUKH/ROMELD/BRAVE/KOUHEI Guidelines for the
Diagnosis and Management of Patients with Thoracic Aortic Disease.
Circulation. 5808; 121: [id number]
3. No pulmonary edema or consolidations.
